# Patient Record
Sex: Female | Born: 1995
Health system: Southern US, Community
[De-identification: ages and names within clinical notes are randomized; demographics above are authoritative.]

---
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
## 2018-12-18 IMAGING — MR MR MRA NECK WO/W CM
3 series · 35 of 48 positions shown · IV contrast (20ml Multihance)
Comparison: MR head and MRA head [DATE]

CLINICAL DATA: Left retinal artery occlusion.

EXAM:
MRA NECK WITHOUT AND WITH CONTRAST
TECHNIQUE: Multiplanar and multiecho pulse sequences of the neck were obtained
without and with intravenous contrast. Angiographic images of the
neck were obtained using MRA technique without and with intravenous
contrast.
CONTRAST:  20mL MULTIHANCE GADOBENATE DIMEGLUMINE 529 MG/ML IV SOLN

[Series 4: fl_tof_2d · axial · 3.0mm · 0.39mm/px · z∈[-194,-96]mm · 10 of 50 slices shown]
[im 1/50]
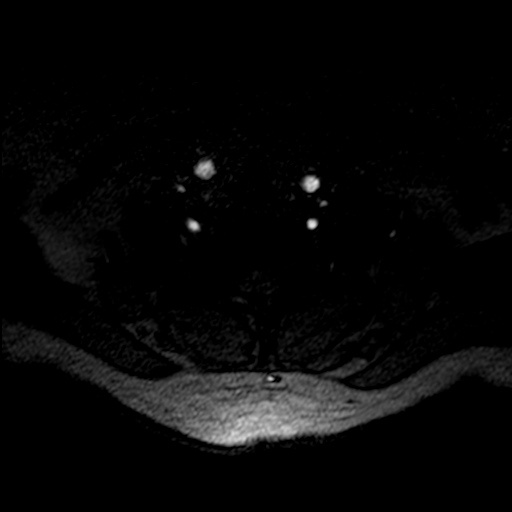
[im 6/50]
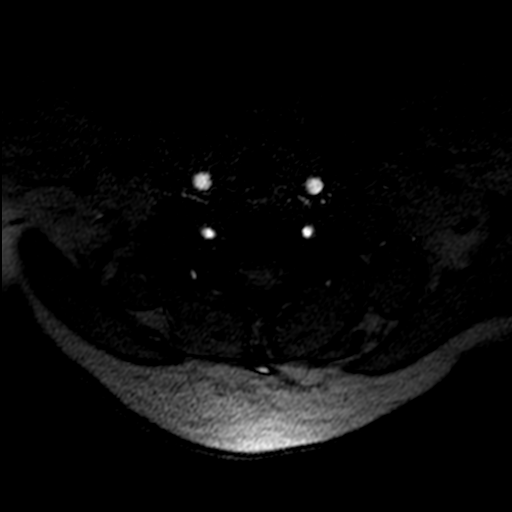
[im 11/50]
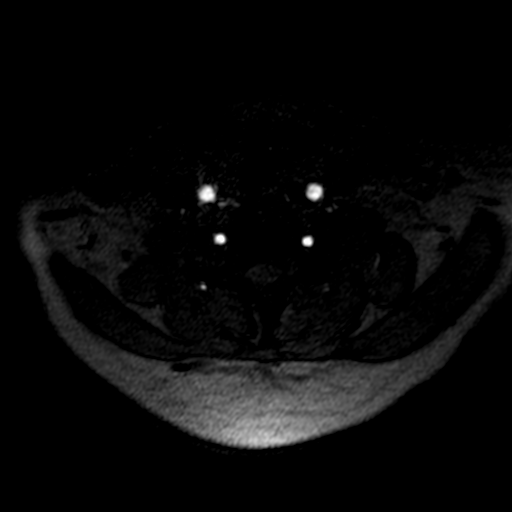
[im 17/50]
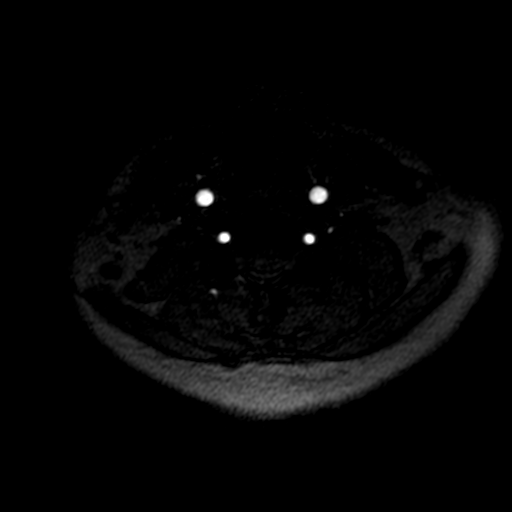
[im 22/50]
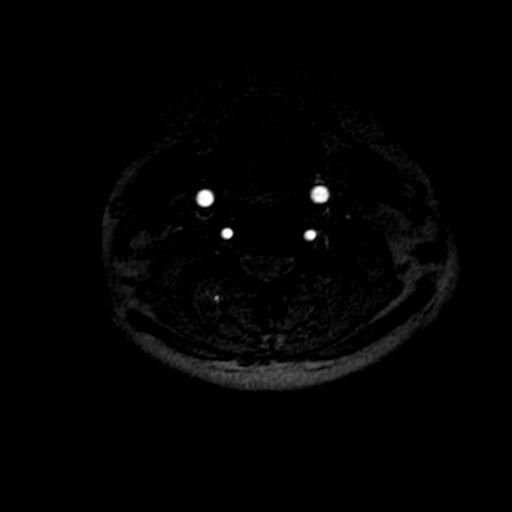
[im 28/50]
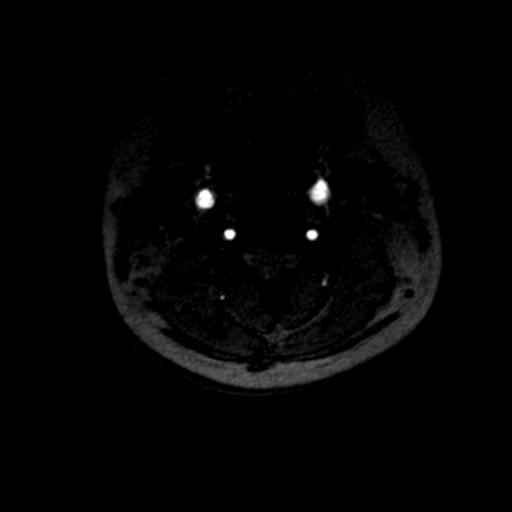
[im 33/50]
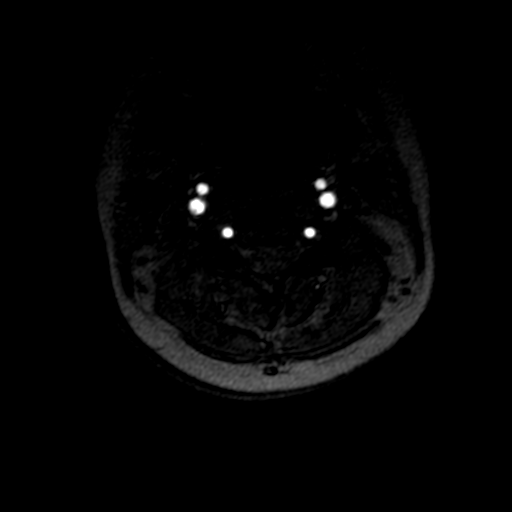
[im 39/50]
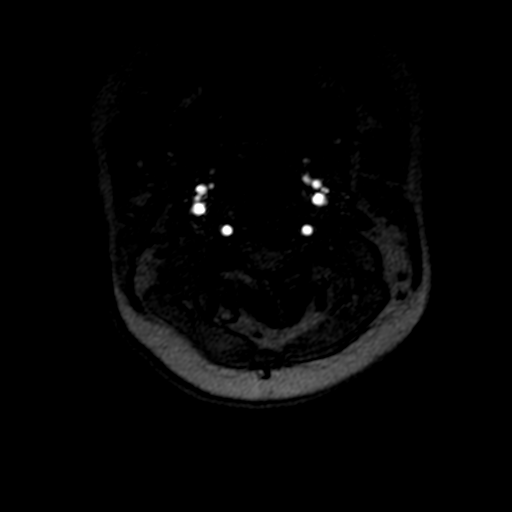
[im 44/50]
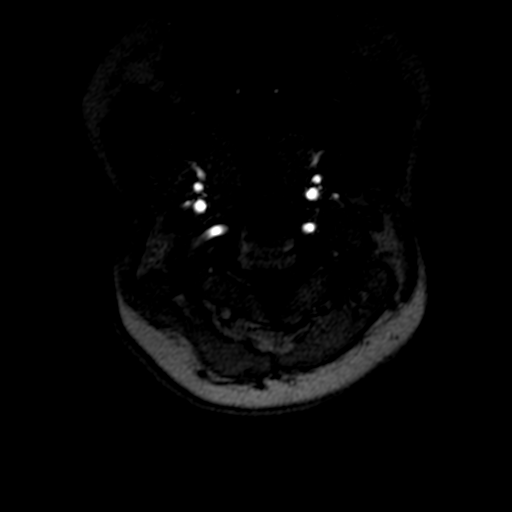
[im 50/50]
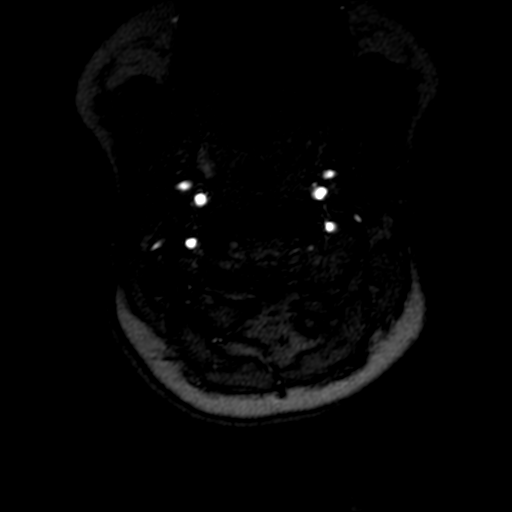

[Series 10: (id)_tt=1.0s · coronal · 0.8mm · 0.78mm/px · 16 of 93 slices shown]
[im 1/93]
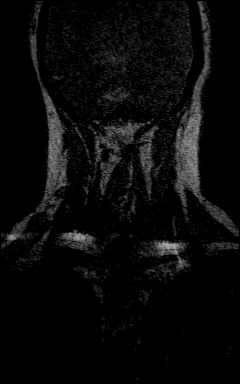
[im 6/93]
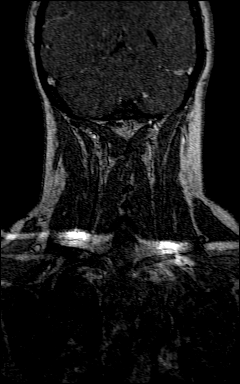
[im 11/93]
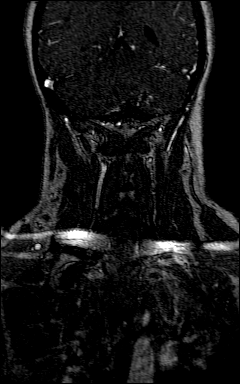
[im 16/93]
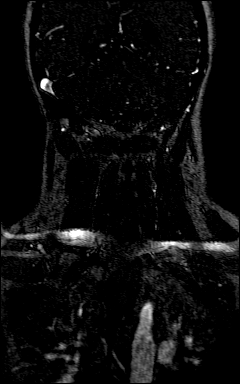
[im 21/93]
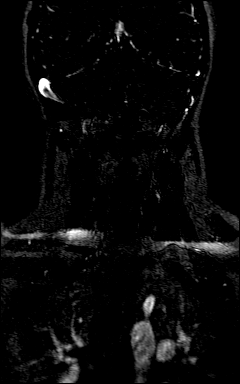
[im 26/93]
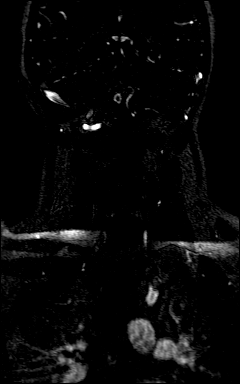
[im 31/93]
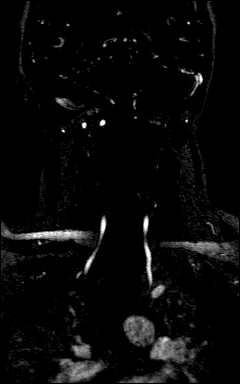
[im 36/93]
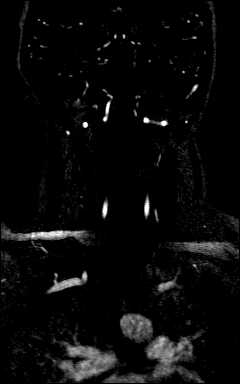
[im 41/93]
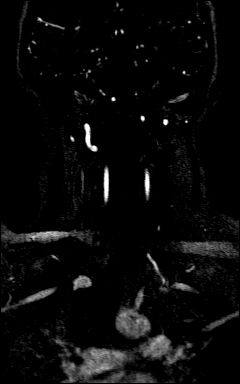
[im 47/93]
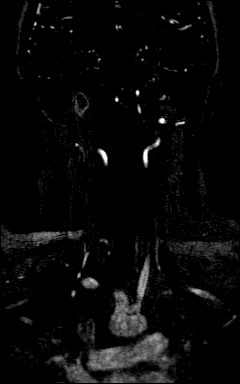
[im 52/93]
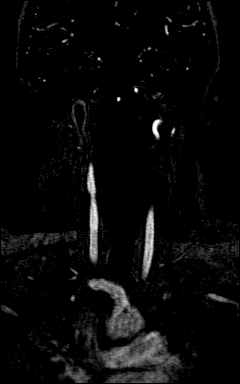
[im 57/93]
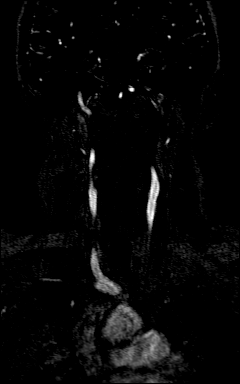
[im 62/93]
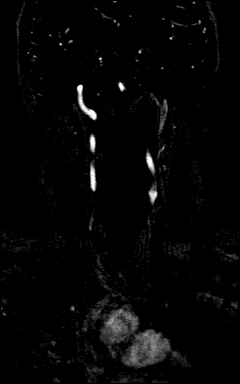
[im 67/93]
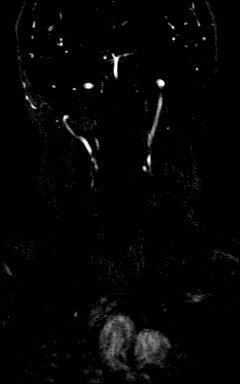
[im 77/93]
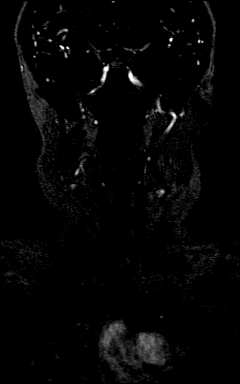
[im 87/93]
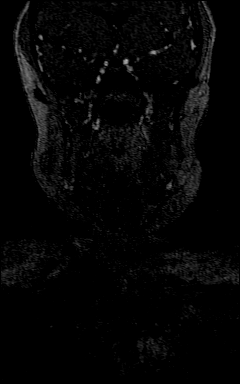

[Series 11: (id)_tt=1.0s_sub · coronal · 0.8mm · 0.78mm/px · 9 of 93 slices shown]
[im 6/93]
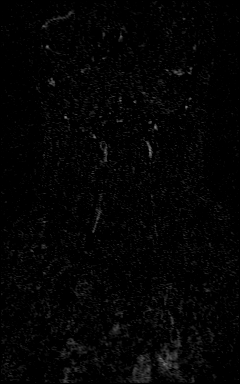
[im 16/93]
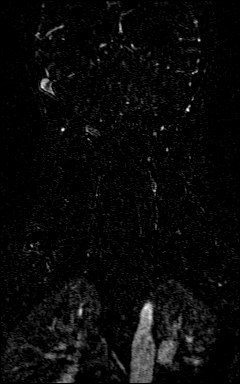
[im 26/93]
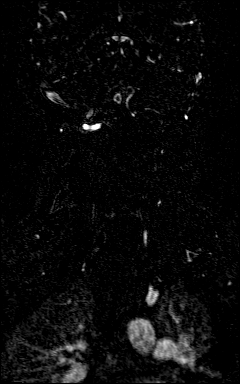
[im 41/93]
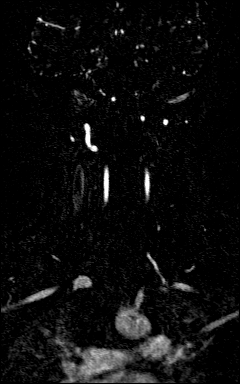
[im 47/93]
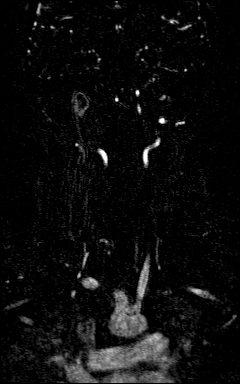
[im 52/93]
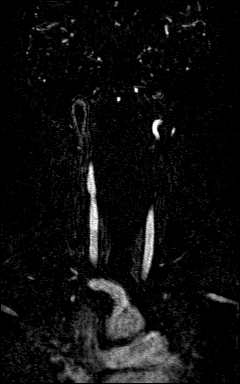
[im 67/93]
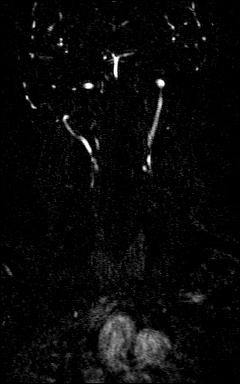
[im 77/93]
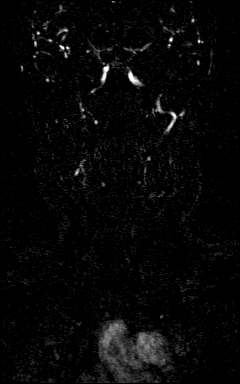
[im 87/93]
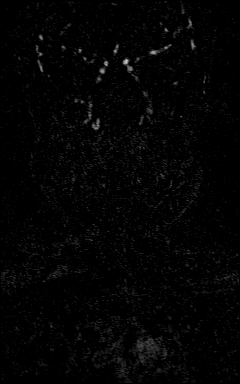

[35 of 48 positions shown; findings below may reference images not displayed]

FINDINGS: The time-of-flight images demonstrate no significant flow
disturbance at either carotid bifurcation. Flow is antegrade in the
vertebral arteries.

Postcontrast images demonstrate a 3 vessel arch configuration. The
arch is unremarkable.

Common carotid arteries are within normal limits bilaterally.
Carotid bifurcations are normal. The internal carotid arteries are
normal bilaterally.

Both vertebral arteries originate from the subclavian arteries. The
vertebral arteries are codominant. There is no focal stenosis.
Vertebrobasilar junction is normal. Basilar artery is normal.
Proximal posterior cerebral arteries are normal.
IMPRESSION: Negative MRA of the neck.

## 2018-12-18 IMAGING — MR MR MRA HEAD W/O CM
1 series · 21 of 48 positions shown · non-contrast
Comparison: No pertinent prior studies available for comparison.

CLINICAL DATA: Retinal artery occlusion, branch, left. Tobacco
abuse. Additional history provided: Increased headaches, especially
after a long day of looking at computer screen for 1 year,
reportedly evaluated by ophthalmology on [DATE] and found to
have left retinal artery branch occlusion with associated retinal
hemorrhage.

EXAM:
MRI HEAD WITHOUT CONTRAST
MRA HEAD WITHOUT CONTRAST
TECHNIQUE: Multiplanar, multiecho pulse sequences of the brain and surrounding
structures were obtained without intravenous contrast. Angiographic
images of the head were obtained using MRA technique without
contrast.

[Series 3: tof_3d_multi-slab · axial · 0.7mm · 0.35mm/px · z∈[-89,+35]mm · 21 of 188 slices shown]
[im 1/188]
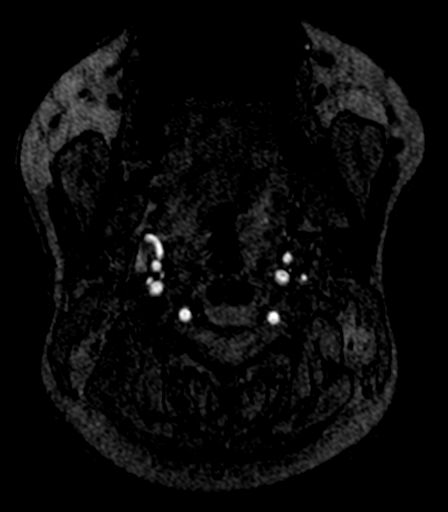
[im 4/188]
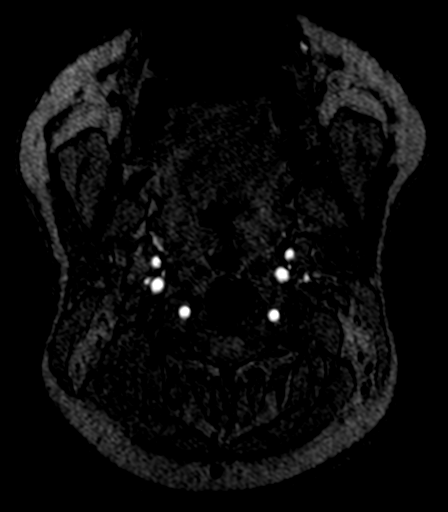
[im 8/188]
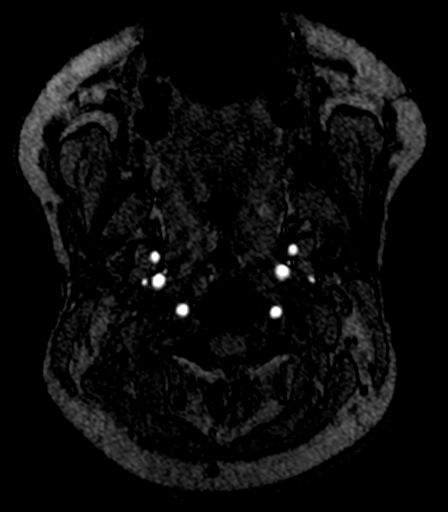
[im 12/188]
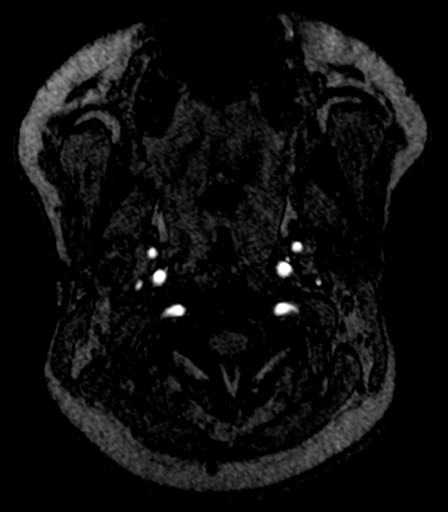
[im 16/188]
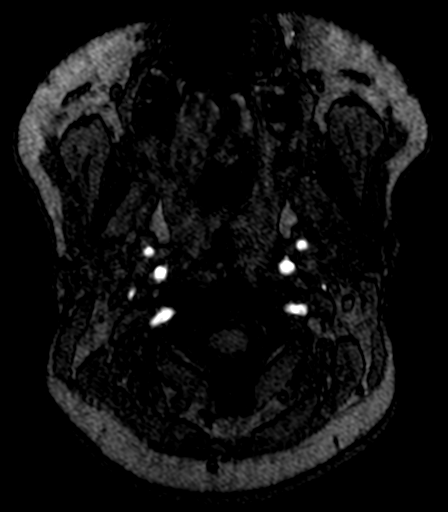
[im 20/188]
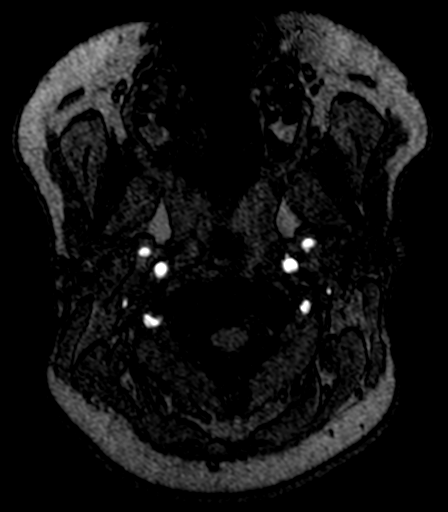
[im 24/188]
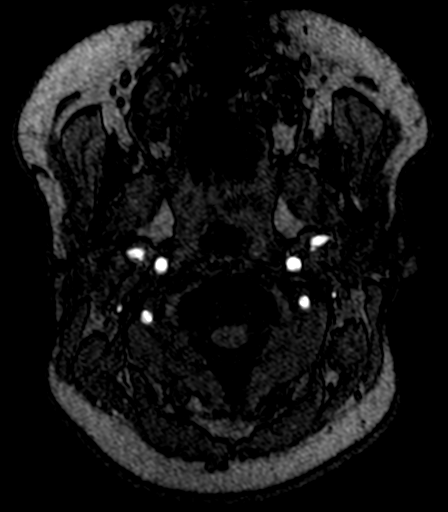
[im 28/188]
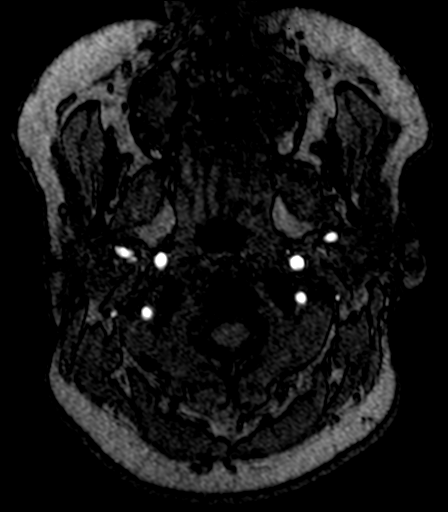
[im 32/188]
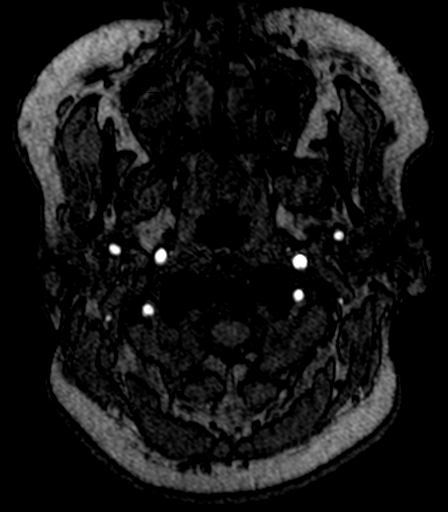
[im 36/188]
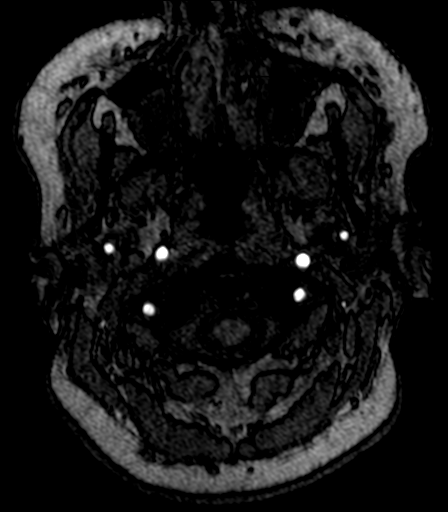
[im 40/188]
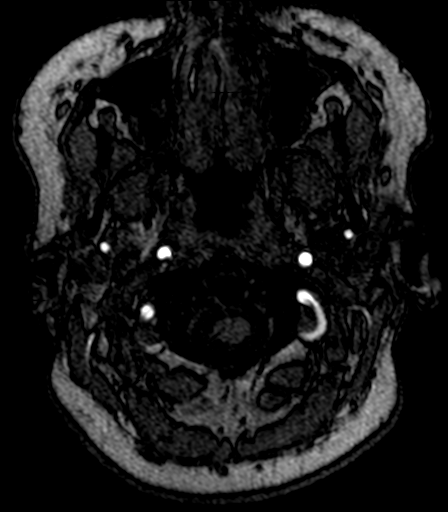
[im 44/188]
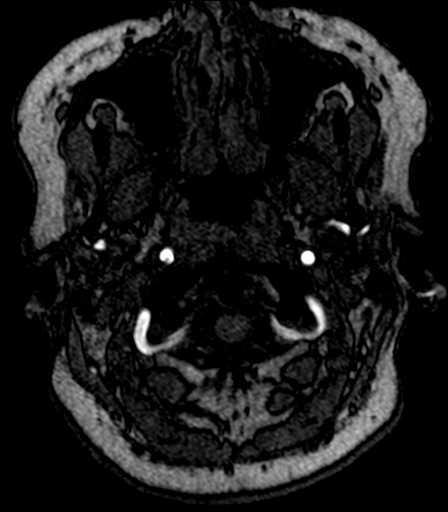
[im 48/188]
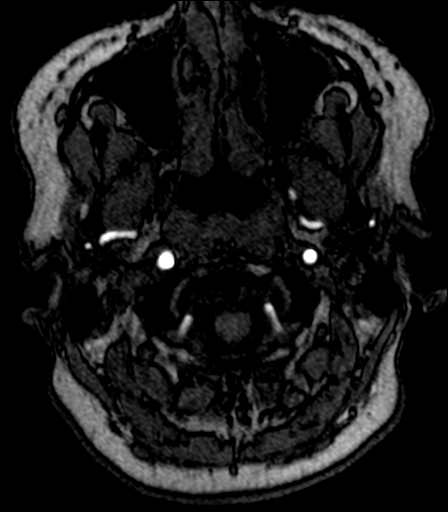
[im 60/188]
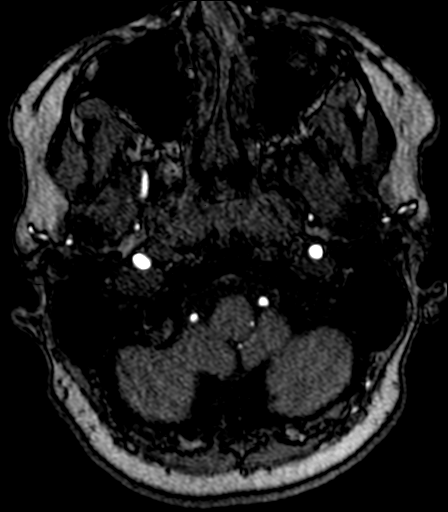
[im 84/188]
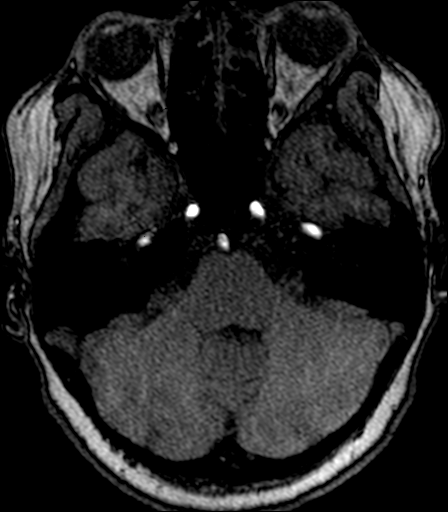
[im 96/188]
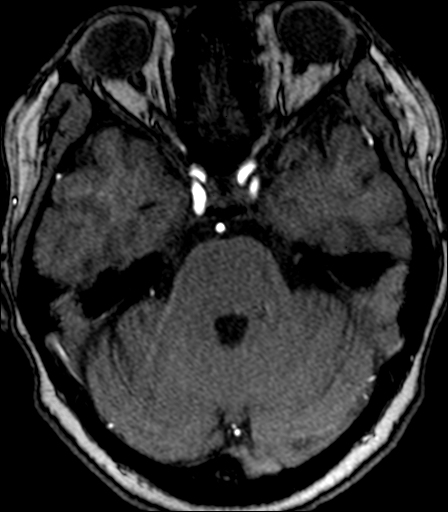
[im 108/188]
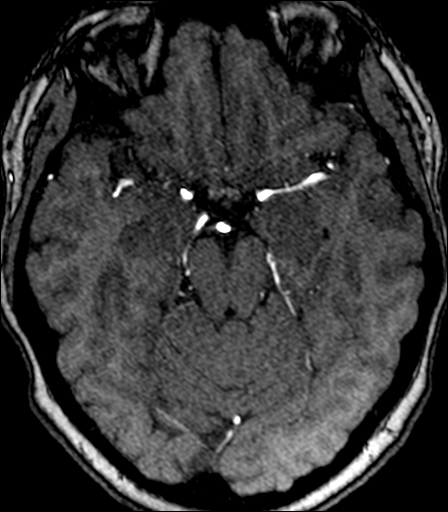
[im 132/188]
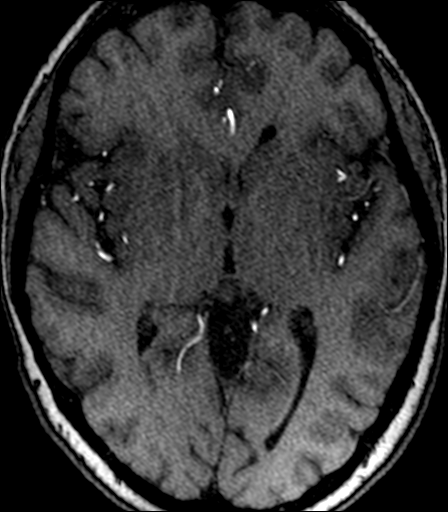
[im 156/188]
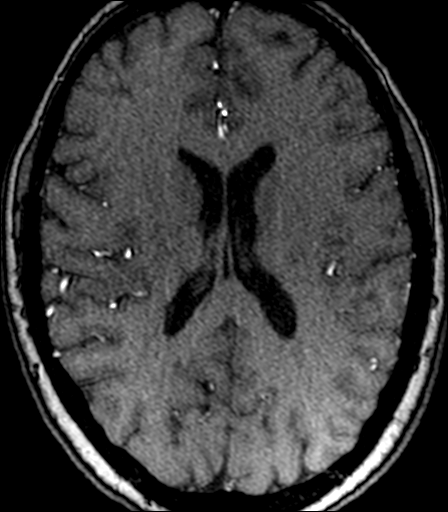
[im 160/188]
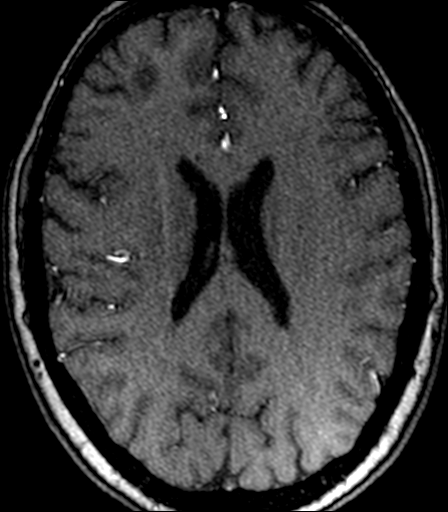
[im 180/188]
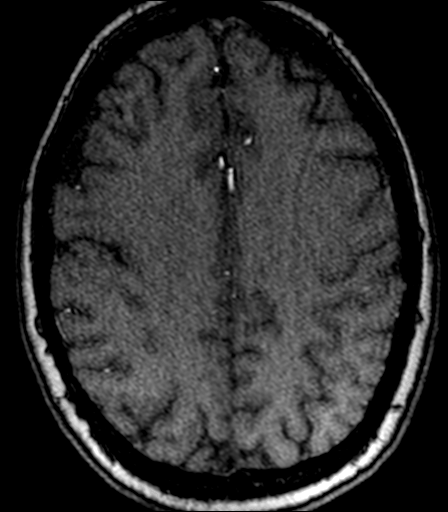

[21 of 48 positions shown; findings below may reference images not displayed]

FINDINGS: MRI HEAD FINDINGS

Brain:

There is no evidence of acute infarct.

No evidence of intracranial mass.

No midline shift or extra-axial fluid collection.

No chronic intracranial blood products.

There are a few nonspecific punctate foci of T2/FLAIR hyperintensity
scattered within the bilateral cerebral white matter.

There is a small nodular focus of signal abnormality along the body
of the right lateral ventricle with signal characteristics similar
to that of gray matter, and this likely reflects nodular
subependymal gray matter heterotopia (series 10, image 89).

Cerebral volume is normal for age.

Vascular: Reported separately

Skull and upper cervical spine: No focal marrow lesion

Sinuses/Orbits: Visualized orbits demonstrate no acute abnormality.
Mild/moderate mucosal thickening within the bilateral ethmoid and
maxillary sinuses. Right maxillary sinus mucous retention cyst.
Trace fluid within left mastoid air cells. Asymmetric T2
hyperintensity within the left petrous apex may reflect trapped
fluid.

MRA HEAD FINDINGS

The intracranial internal carotid arteries are patent without
significant stenosis. The bilateral middle and anterior cerebral
arteries are patent without significant proximal stenosis. No
intracranial aneurysm is identified.

The intracranial vertebral arteries and basilar artery are patent
without significant stenosis. The bilateral posterior cerebral
arteries are patent without significant proximal stenosis.
IMPRESSION: MRI brain:

1. No evidence of acute intracranial abnormality.
2. There are few nonspecific punctate signal changes scattered
throughout the bilateral cerebral white matter.
3. Probable subependymal nodular gray matter heterotopia along the
body of the right lateral ventricle, as described.
4. Trace left mastoid effusion. Asymmetric T2 hyperintensity within
the left petrous apex may reflect trapped fluid.
5. Paranasal sinus disease as described.

MRA head:

1. Unremarkable MR angiography of the head. No intracranial large
vessel occlusion or proximal high-grade arterial stenosis.
2. No intracranial aneurysm is identified.

## 2018-12-18 IMAGING — MR MR HEAD W/O CM
10 series · 48 of 48 positions shown · non-contrast
Comparison: No pertinent prior studies available for comparison.

CLINICAL DATA: Retinal artery occlusion, branch, left. Tobacco
abuse. Additional history provided: Increased headaches, especially
after a long day of looking at computer screen for 1 year,
reportedly evaluated by ophthalmology on [DATE] and found to
have left retinal artery branch occlusion with associated retinal
hemorrhage.

EXAM:
MRI HEAD WITHOUT CONTRAST
MRA HEAD WITHOUT CONTRAST
TECHNIQUE: Multiplanar, multiecho pulse sequences of the brain and surrounding
structures were obtained without intravenous contrast. Angiographic
images of the head were obtained using MRA technique without
contrast.

[Series 1: T1 · sagittal · 5.0mm · 0.45mm/px · 3 of 24 slices shown]
[im 1/24]
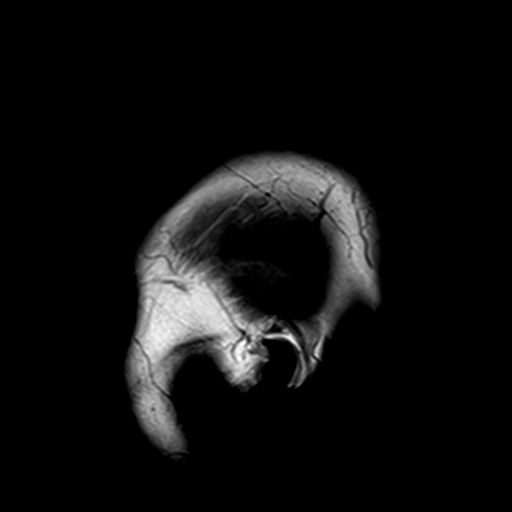
[im 12/24]
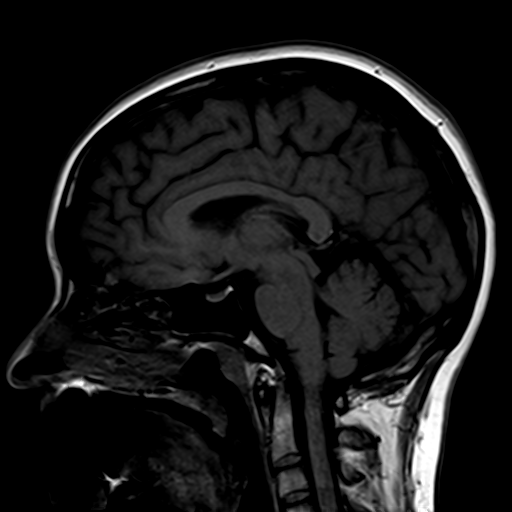
[im 24/24]
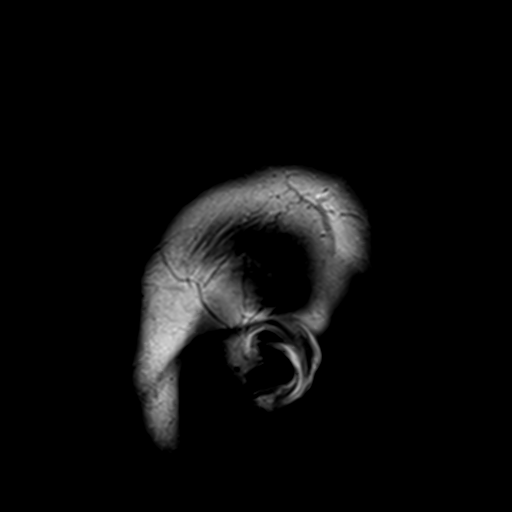

[Series 2: DWI · axial · 3.0mm · 1.80mm/px · z∈[-71,+88]mm · 9 of 108 slices shown (1 of 4)]
[im 1/108]
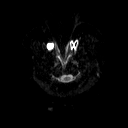
[im 14/108]
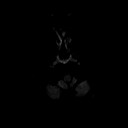
[im 27/108]
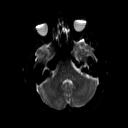
[im 41/108]
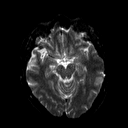
[im 54/108]
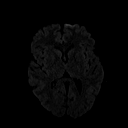
[im 67/108]
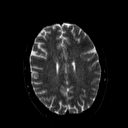
[im 81/108]
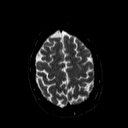
[im 94/108]
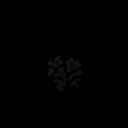
[im 108/108]
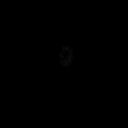

[Series 3: DWI · axial · 3.0mm · 1.80mm/px · z∈[-71,+88]mm · 4 of 54 slices shown (2 of 4)]
[im 1/54]
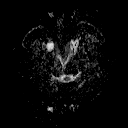
[im 18/54]
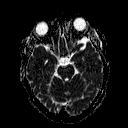
[im 36/54]
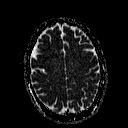
[im 54/54]
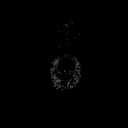

[Series 4: DWI · coronal · 5.0mm · 1.80mm/px · 6 of 75 slices shown (3 of 4)]
[im 1/75]
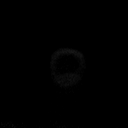
[im 15/75]
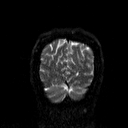
[im 30/75]
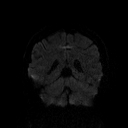
[im 45/75]
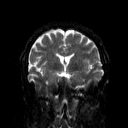
[im 60/75]
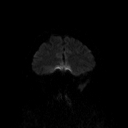
[im 75/75]
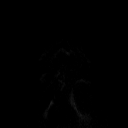

[Series 5: DWI · coronal · 5.0mm · 1.80mm/px · 3 of 38 slices shown (4 of 4)]
[im 1/38]
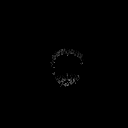
[im 19/38]
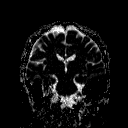
[im 38/38]
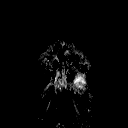

[Series 6: T2 · axial · 5.0mm · 0.51mm/px · z∈[-75,+93]mm · 2 of 26 slices shown (1 of 2)]
[im 1/26]
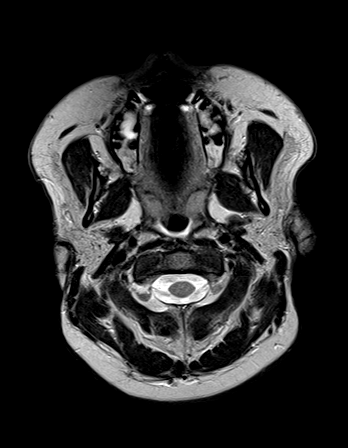
[im 26/26]
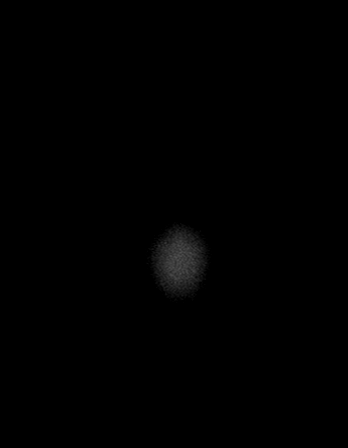

[Series 7: FLAIR · axial · 3.0mm · 0.45mm/px · z∈[-72,+90]mm · 3 of 36 slices shown]
[im 1/36]
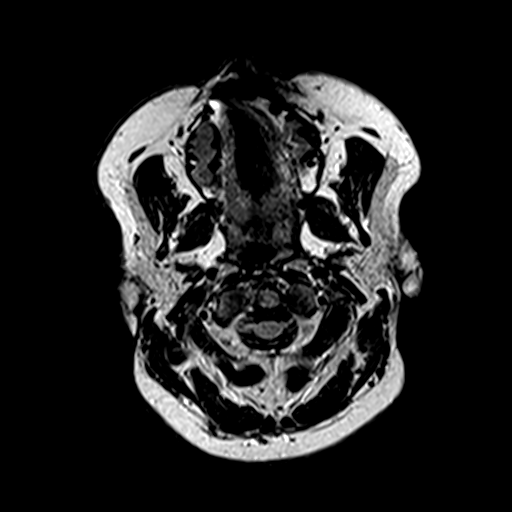
[im 18/36]
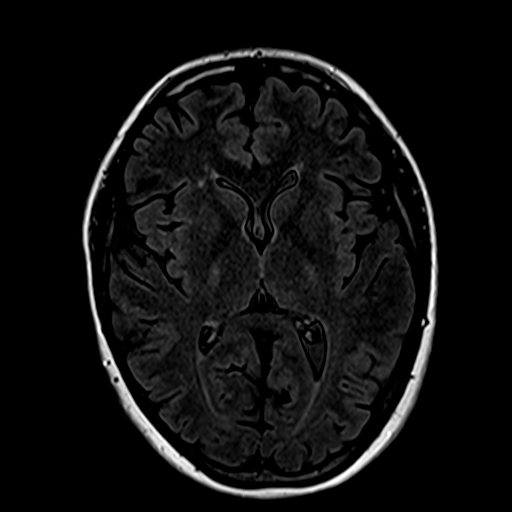
[im 36/36]
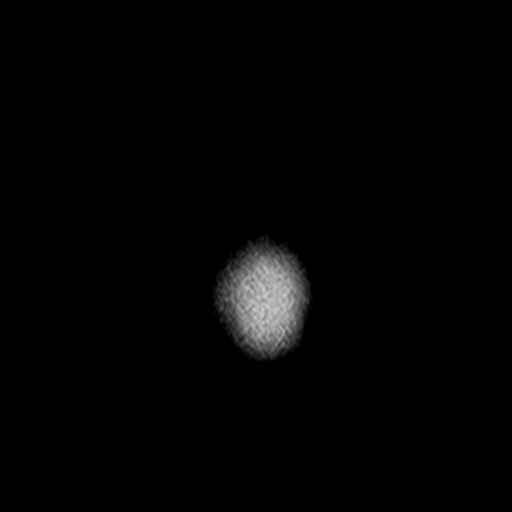

[Series 9: swi_images · axial · 4.0mm · 0.90mm/px · z∈[-77,+94]mm · 4 of 44 slices shown]
[im 1/44]
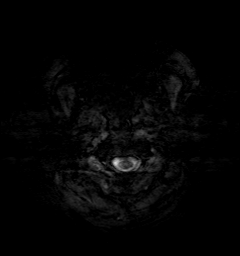
[im 15/44]
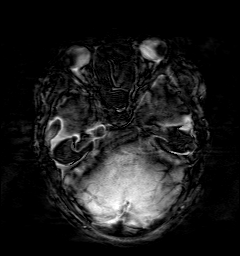
[im 29/44]
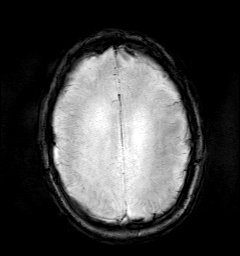
[im 44/44]
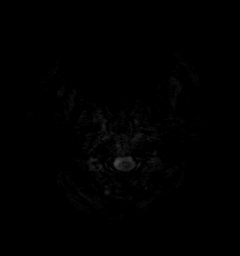

[Series 10: t1_mpr_tra · axial · 1.0mm · 0.75mm/px · z∈[-62,+84]mm · 12 of 144 slices shown]
[im 1/144]
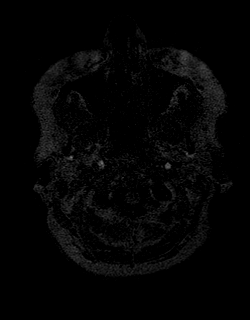
[im 14/144]
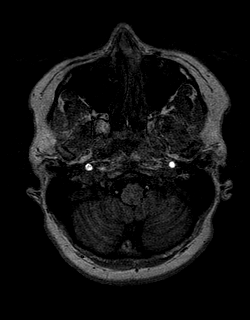
[im 27/144]
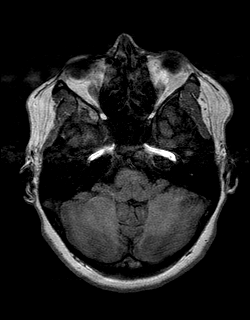
[im 40/144]
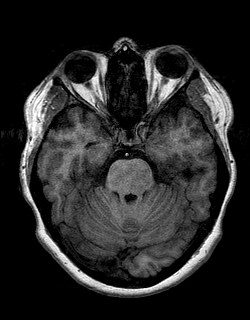
[im 53/144]
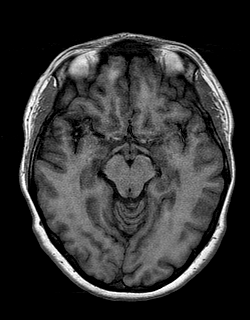
[im 66/144]
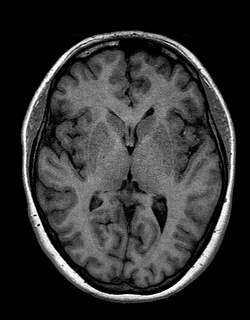
[im 79/144]
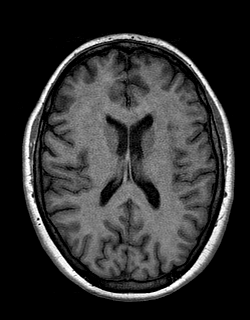
[im 92/144]
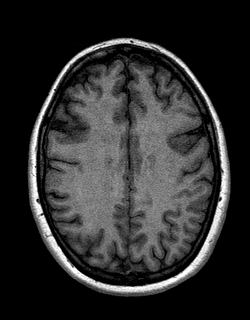
[im 105/144]
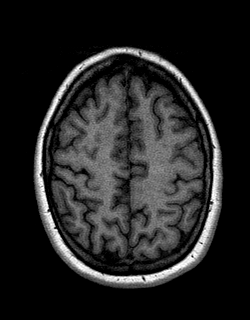
[im 118/144]
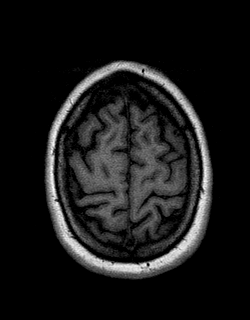
[im 131/144]
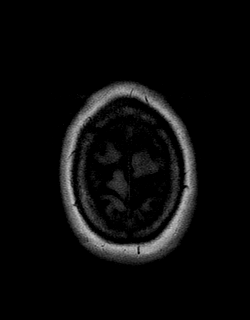
[im 144/144]
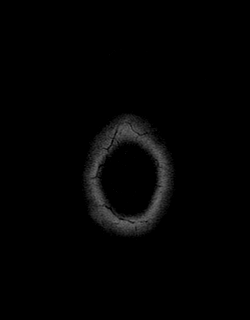

[Series 11: T2 · coronal · 5.0mm · 0.45mm/px · 2 of 30 slices shown (2 of 2)]
[im 1/30]
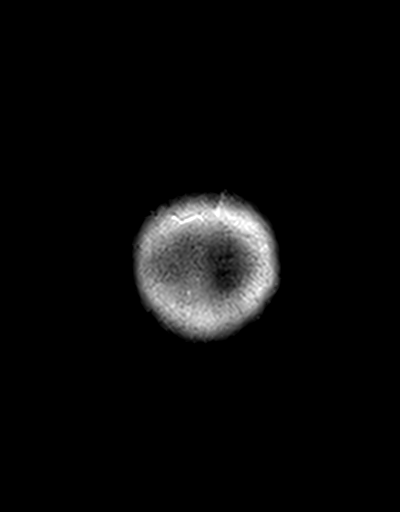
[im 30/30]
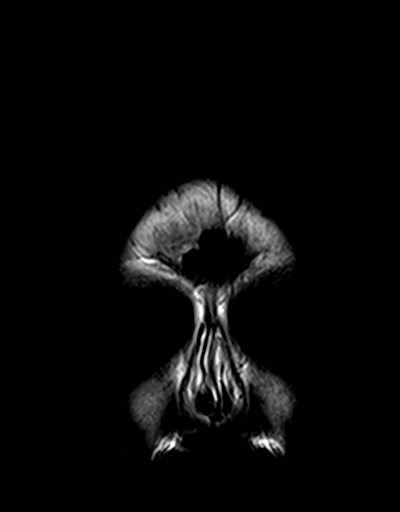

[48 of 48 positions shown; findings below may reference images not displayed]

FINDINGS: MRI HEAD FINDINGS

Brain:

There is no evidence of acute infarct.

No evidence of intracranial mass.

No midline shift or extra-axial fluid collection.

No chronic intracranial blood products.

There are a few nonspecific punctate foci of T2/FLAIR hyperintensity
scattered within the bilateral cerebral white matter.

There is a small nodular focus of signal abnormality along the body
of the right lateral ventricle with signal characteristics similar
to that of gray matter, and this likely reflects nodular
subependymal gray matter heterotopia (series 10, image 89).

Cerebral volume is normal for age.

Vascular: Reported separately

Skull and upper cervical spine: No focal marrow lesion

Sinuses/Orbits: Visualized orbits demonstrate no acute abnormality.
Mild/moderate mucosal thickening within the bilateral ethmoid and
maxillary sinuses. Right maxillary sinus mucous retention cyst.
Trace fluid within left mastoid air cells. Asymmetric T2
hyperintensity within the left petrous apex may reflect trapped
fluid.

MRA HEAD FINDINGS

The intracranial internal carotid arteries are patent without
significant stenosis. The bilateral middle and anterior cerebral
arteries are patent without significant proximal stenosis. No
intracranial aneurysm is identified.

The intracranial vertebral arteries and basilar artery are patent
without significant stenosis. The bilateral posterior cerebral
arteries are patent without significant proximal stenosis.
IMPRESSION: MRI brain:

1. No evidence of acute intracranial abnormality.
2. There are few nonspecific punctate signal changes scattered
throughout the bilateral cerebral white matter.
3. Probable subependymal nodular gray matter heterotopia along the
body of the right lateral ventricle, as described.
4. Trace left mastoid effusion. Asymmetric T2 hyperintensity within
the left petrous apex may reflect trapped fluid.
5. Paranasal sinus disease as described.

MRA head:

1. Unremarkable MR angiography of the head. No intracranial large
vessel occlusion or proximal high-grade arterial stenosis.
2. No intracranial aneurysm is identified.
